# Patient Record
Sex: Male | Born: 1950
Health system: Southern US, Community
[De-identification: ages and names within clinical notes are randomized; demographics above are authoritative.]

## PROBLEM LIST (undated history)

## (undated) DIAGNOSIS — J189 Pneumonia, unspecified organism: Secondary | ICD-10-CM

## (undated) HISTORY — DX: Pneumonia, unspecified organism: J18.9

## (undated) HISTORY — PX: VALVE REPLACEMENT: SUR13

---

## 2020-11-05 ENCOUNTER — Encounter: Payer: Self-pay | Admitting: Family Medicine

## 2020-11-05 ENCOUNTER — Ambulatory Visit (INDEPENDENT_AMBULATORY_CARE_PROVIDER_SITE_OTHER): Payer: Medicare Other | Admitting: Family Medicine

## 2020-11-05 ENCOUNTER — Other Ambulatory Visit: Payer: Self-pay

## 2020-11-05 VITALS — BP 150/80 | HR 91 | Temp 97.7°F | Ht 64.0 in | Wt 103.0 lb

## 2020-11-05 DIAGNOSIS — Z1211 Encounter for screening for malignant neoplasm of colon: Secondary | ICD-10-CM | POA: Diagnosis not present

## 2020-11-05 DIAGNOSIS — J449 Chronic obstructive pulmonary disease, unspecified: Secondary | ICD-10-CM

## 2020-11-05 DIAGNOSIS — Z13220 Encounter for screening for lipoid disorders: Secondary | ICD-10-CM | POA: Diagnosis not present

## 2020-11-05 DIAGNOSIS — Z87891 Personal history of nicotine dependence: Secondary | ICD-10-CM

## 2020-11-05 DIAGNOSIS — Z23 Encounter for immunization: Secondary | ICD-10-CM

## 2020-11-05 DIAGNOSIS — R351 Nocturia: Secondary | ICD-10-CM

## 2020-11-05 NOTE — Patient Instructions (Addendum)
It was very nice to see you today!  We will check blood work today.  I will place a referral for you to see a pulmonologist.  We will give your flu vaccine and pneumonia vaccine today.  I will see back in 6 to 12 months.  Please come back to see me sooner if needed.  Take care, Dr Jimmey Ralph  Please try these tips to maintain a healthy lifestyle:   Eat at least 3 REAL meals and 1-2 snacks per day.  Aim for no more than 5 hours between eating.  If you eat breakfast, please do so within one hour of getting up.    Each meal should contain half fruits/vegetables, one quarter protein, and one quarter carbs (no bigger than a computer mouse)   Cut down on sweet beverages. This includes juice, soda, and sweet tea.     Drink at least 1 glass of water with each meal and aim for at least 8 glasses per day   Exercise at least 150 minutes every week.

## 2020-11-05 NOTE — Assessment & Plan Note (Signed)
Will likely need lung cancer screening, though will defer to pulmonology.

## 2020-11-05 NOTE — Assessment & Plan Note (Signed)
Normal exam today.  No red flags.  Will place referral to pulmonology for further testing and management.

## 2020-11-05 NOTE — Progress Notes (Signed)
Matthew Coleman is a 69 y.o. male who presents today for an office visit.  He is a new patient and his here with his daughter today.   Assessment/Plan:  Chronic Problems Addressed Today: COPD (chronic obstructive pulmonary disease) (HCC) Normal exam today.  No red flags.  Will place referral to pulmonology for further testing and management.  Former smoker Will likely need lung cancer screening, though will defer to pulmonology.  Preventative Healthcare Flu and pneumonia vaccines given today.  Will check labs including CBC, CMET, TSH, lipid panel, and PSA.  He will be getting COVID vaccine at pharmacy.  Discussed colon cancer screening and will place order for Cologuard.  Follow up in 6-12 months.     Subjective:  HPI:  Patient here to establish care as a new patient.  He recently relocated to [place] about 2 months ago. Was previously in [place]. He moved to be closer to his family after being admitted to the hospital 2 months ago with pneumonia.  He initially presented to the ED with chest pressure and shortness of breath.  Covid test was negative.  Admitted for IV antibiotics including ceftriaxone and azithromycin.  Was concern for COPD and he was given IV steroids as well.  Was initially on BiPAP.  Was able to be weaned off and was discharged home after 6-day hospital stay.  He has been doing well since being discharged home.  He is now living by himself.  Since being admitted with pneumonia he has stopped smoking and stopped drinking alcohol.  His daughter would like for him to be referred to a pulmonologist today.  He has about a 50-pack-year history of smoking.  Denies any dyspnea on exertion.  No chest pain.  No shortness of breath.  No cough.  Medical history otherwise only significant for valve replacement several years ago.  Patient cannot remember why this was done other than to "open things up. "  He has not seen a cardiologist in several years.  He has never had a  primary care physician.  ROS: Per HPI, otherwise a complete review of systems was negative.   PMH:  The following were reviewed and entered/updated in epic: History reviewed. No pertinent past medical history. Patient Active Problem List   Diagnosis Date Noted  . COPD (chronic obstructive pulmonary disease) (HCC) 11/05/2020  . Former smoker 11/05/2020   Past Surgical History:  Procedure Laterality Date  . VALVE REPLACEMENT      Family History  Problem Relation Age of Onset  . Stroke Mother     Medications- reviewed and updated No current outpatient medications on file.   No current facility-administered medications for this visit.    Allergies-reviewed and updated Allergies  Allergen Reactions  . Penicillin G Hives and Itching    Social History   Socioeconomic History  . Marital status: Single    Spouse name: Not on file  . Number of children: Not on file  . Years of education: Not on file  . Highest education level: Not on file  Occupational History  . Not on file  Tobacco Use  . Smoking status: Former Smoker    Packs/day: 1.00    Years: 50.00    Pack years: 50.00    Types: Cigarettes    Quit date: 07/26/2020    Years since quitting: 0.2  Substance and Sexual Activity  . Alcohol use: Yes    Comment: last beer 07/26/2020  . Drug use: Not Currently    Types:  Marijuana  . Sexual activity: Not on file  Other Topics Concern  . Not on file  Social History Narrative  . Not on file   Social Determinants of Health   Financial Resource Strain:   . Difficulty of Paying Living Expenses: Not on file  Food Insecurity:   . Worried About Programme researcher, broadcasting/film/video in the Last Year: Not on file  . Ran Out of Food in the Last Year: Not on file  Transportation Needs:   . Lack of Transportation (Medical): Not on file  . Lack of Transportation (Non-Medical): Not on file  Physical Activity:   . Days of Exercise per Week: Not on file  . Minutes of Exercise per Session:  Not on file  Stress:   . Feeling of Stress : Not on file  Social Connections:   . Frequency of Communication with Friends and Family: Not on file  . Frequency of Social Gatherings with Friends and Family: Not on file  . Attends Religious Services: Not on file  . Active Member of Clubs or Organizations: Not on file  . Attends Banker Meetings: Not on file  . Marital Status: Not on file          Objective:  Physical Exam: BP (!) 150/80   Pulse 91   Temp 97.7 F (36.5 C) (Temporal)   Ht 5\' 4"  (1.626 m)   Wt 103 lb (46.7 kg)   SpO2 99%   BMI 17.68 kg/m   Gen: No acute distress, resting comfortably CV: Regular rate and rhythm with no murmurs appreciated Pulm: Normal work of breathing, clear to auscultation bilaterally with no crackles, wheezes, or rhonchi Neuro: Grossly normal, moves all extremities Psych: Normal affect and thought content  Time Spent: 65 minutes of total time was spent on the date of the encounter performing the following actions: chart review prior to seeing the patient including records from recent hospitalization, obtaining history, performing a medically necessary exam, counseling on the treatment plan, placing orders, and documenting in our EHR.        . Katina Degree, MD 11/05/2020 11:32 AM

## 2020-11-06 LAB — CBC
HCT: 40.4 % (ref 38.5–50.0)
Hemoglobin: 12.9 g/dL — ABNORMAL LOW (ref 13.2–17.1)
MCH: 29.3 pg (ref 27.0–33.0)
MCHC: 31.9 g/dL — ABNORMAL LOW (ref 32.0–36.0)
MCV: 91.8 fL (ref 80.0–100.0)
MPV: 9.1 fL (ref 7.5–12.5)
Platelets: 308 10*3/uL (ref 140–400)
RBC: 4.4 10*6/uL (ref 4.20–5.80)
RDW: 12.7 % (ref 11.0–15.0)
WBC: 7.7 10*3/uL (ref 3.8–10.8)

## 2020-11-06 LAB — COMPREHENSIVE METABOLIC PANEL WITH GFR
AG Ratio: 1.2 (calc) (ref 1.0–2.5)
ALT: 15 U/L (ref 9–46)
AST: 25 U/L (ref 10–35)
Albumin: 4.2 g/dL (ref 3.6–5.1)
Alkaline phosphatase (APISO): 74 U/L (ref 35–144)
BUN: 12 mg/dL (ref 7–25)
CO2: 27 mmol/L (ref 20–32)
Calcium: 9.9 mg/dL (ref 8.6–10.3)
Chloride: 101 mmol/L (ref 98–110)
Creat: 0.8 mg/dL (ref 0.70–1.18)
Globulin: 3.4 g/dL (ref 1.9–3.7)
Glucose, Bld: 83 mg/dL (ref 65–99)
Potassium: 4.4 mmol/L (ref 3.5–5.3)
Sodium: 137 mmol/L (ref 135–146)
Total Bilirubin: 0.6 mg/dL (ref 0.2–1.2)
Total Protein: 7.6 g/dL (ref 6.1–8.1)

## 2020-11-06 LAB — LIPID PANEL
Cholesterol: 149 mg/dL (ref <200)
HDL: 59 mg/dL (ref >=40)
LDL Cholesterol (Calc): 72 mg/dL (calc)
Non-HDL Cholesterol (Calc): 90 mg/dL (calc) (ref <130)
Total CHOL/HDL Ratio: 2.5 (calc) (ref <5.0)
Triglycerides: 92 mg/dL (ref <150)

## 2020-11-06 LAB — TSH: TSH: 0.01 m[IU]/L — ABNORMAL LOW (ref 0.40–4.50)

## 2020-11-06 LAB — PSA: PSA: 0.43 ng/mL (ref <=4.0)

## 2020-11-06 NOTE — Progress Notes (Signed)
Please inform patient of the following:  Blood work looks like he has an overactive thyroid. Recommend he come back to recheck. Please place future order for TSH, free T4, and free T3.  Matthew Coleman. Jimmey Ralph, MD 11/06/2020 8:02 AM

## 2020-11-09 ENCOUNTER — Telehealth: Payer: Self-pay

## 2020-11-09 ENCOUNTER — Other Ambulatory Visit: Payer: Self-pay

## 2020-11-09 DIAGNOSIS — E0590 Thyrotoxicosis, unspecified without thyrotoxic crisis or storm: Secondary | ICD-10-CM

## 2020-11-09 NOTE — Telephone Encounter (Signed)
Returned pt call and labs reviewed.  

## 2020-11-09 NOTE — Telephone Encounter (Signed)
Patient called regarding lab results 

## 2020-11-16 ENCOUNTER — Other Ambulatory Visit: Payer: Medicare Other

## 2020-11-16 ENCOUNTER — Other Ambulatory Visit: Payer: Self-pay

## 2020-11-16 DIAGNOSIS — E0590 Thyrotoxicosis, unspecified without thyrotoxic crisis or storm: Secondary | ICD-10-CM | POA: Diagnosis not present

## 2020-11-16 LAB — TSH: TSH: <0.01 mIU/L — ABNORMAL LOW (ref 0.40–4.50)

## 2020-11-16 LAB — T4, FREE: Free T4: 1.6 ng/dL (ref 0.8–1.8)

## 2020-11-16 LAB — T3, FREE: T3, Free: 4.5 pg/mL — ABNORMAL HIGH (ref 2.3–4.2)

## 2020-11-17 ENCOUNTER — Other Ambulatory Visit: Payer: Self-pay | Admitting: *Deleted

## 2020-11-17 DIAGNOSIS — R946 Abnormal results of thyroid function studies: Secondary | ICD-10-CM

## 2020-11-17 NOTE — Progress Notes (Signed)
Please inform patient of the following:  Blood test confirms he is getting too much thyroid. Recommend referral to endocrinology for further evaluation and management.  Matthew Coleman. Jimmey Ralph, MD 11/17/2020 2:24 PM

## 2021-01-01 ENCOUNTER — Other Ambulatory Visit: Payer: Self-pay

## 2021-01-01 ENCOUNTER — Encounter: Payer: Self-pay | Admitting: Endocrinology

## 2021-01-01 ENCOUNTER — Ambulatory Visit: Payer: Medicare Other | Admitting: Endocrinology

## 2021-01-01 DIAGNOSIS — E0590 Thyrotoxicosis, unspecified without thyrotoxic crisis or storm: Secondary | ICD-10-CM | POA: Diagnosis not present

## 2021-01-01 LAB — TSH: TSH: 2.28 u[IU]/mL (ref 0.35–4.50)

## 2021-01-01 LAB — T4, FREE: Free T4: 0.82 ng/dL (ref 0.60–1.60)

## 2021-01-01 NOTE — Progress Notes (Signed)
Subjective:    Patient ID: Matthew Coleman, male    DOB: 26-May-1951, 70 y.o.   MRN: [id number]  HPI Pt is referred by Dr Jimmey Ralph, for hyperthyroidism.  he was dx'ed with hyperthyroidism in 2021.  He has never been on therapy for this.  He has never had XRT to the anterior neck, or thyroid surgery.  He has never had thyroid imaging.  He does not consume kelp or any other non-prescribed thyroid medication.  He has never been on amiodarone.  He lost weight during admission, 5 mos ago.  He has since regained.   History reviewed. No pertinent past medical history.  Past Surgical History:  Procedure Laterality Date  . VALVE REPLACEMENT      Social History   Socioeconomic History  . Marital status: Single    Spouse name: Not on file  . Number of children: Not on file  . Years of education: Not on file  . Highest education level: Not on file  Occupational History  . Not on file  Tobacco Use  . Smoking status: Former Smoker    Packs/day: 1.00    Years: 50.00    Pack years: 50.00    Types: Cigarettes    Quit date: 07/26/2020    Years since quitting: 0.4  . Smokeless tobacco: Never Used  Substance and Sexual Activity  . Alcohol use: Yes    Comment: last beer 07/26/2020  . Drug use: Not Currently    Types: Marijuana  . Sexual activity: Not on file  Other Topics Concern  . Not on file  Social History Narrative  . Not on file   Social Determinants of Health   Financial Resource Strain: Not on file  Food Insecurity: Not on file  Transportation Needs: Not on file  Physical Activity: Not on file  Stress: Not on file  Social Connections: Not on file  Intimate Partner Violence: Not on file    No current outpatient medications on file prior to visit.   No current facility-administered medications on file prior to visit.    Allergies  Allergen Reactions  . Penicillin G Hives and Itching    Family History  Problem Relation Age of Onset  . Stroke Mother   . Thyroid  disease Neg Hx     BP 140/88   Pulse 88   Ht 5\' 5"  (1.651 m)   Wt 101 lb (45.8 kg)   SpO2 99%   BMI 16.81 kg/m   Review of Systems denies palpitations, sob, fever, excessive diaphoresis, tremor, anxiety, and heat intolerance.       Objective:   Physical Exam VS: see vs page GEN: no distress HEAD: head: no deformity eyes: no periorbital swelling, no proptosis external nose and ears are normal NECK: supple, thyroid is not enlarged CHEST WALL: no deformity LUNGS: clear to auscultation CV: reg rate and rhythm, no murmur.  MUSCULOSKELETAL: gait is normal and steady EXTEMITIES: no deformity.  no leg edema NEURO:  readily moves all 4's.  sensation is intact to touch on all 4's.  No tremor.   SKIN:  Normal texture and temperature.  No rash or suspicious lesion is visible.  Not diaphoretic NODES:  None palpable at the neck PSYCH: alert, well-oriented.  Does not appear anxious nor depressed.   Lab Results  Component Value Date   TSH <0.01 (L) 11/16/2020   Lab Results  Component Value Date   TSH 2.28 01/01/2021   I have reviewed outside records, and summarized: Pt  was noted to have low TSH, and referred here.  Wellness and COPD were also addressed.       Assessment & Plan:  Hyperthyroidism, new to me.  Resolved on recheck. He is at high risk for recurrent abnormal thyroid function.  We'll hold off on medication for now Please come back for a follow-up appointment in 1 month.

## 2021-01-01 NOTE — Patient Instructions (Addendum)
Blood tests are requested for you today.  We'll let you know about the results.  If it is high again, I'll prescribe for a the pill to slow it down.  If ever you have fever while taking this, stop it and call us, even if the reason is obvious, because of the risk of a rare side-effect. It is best to never miss the medication.  However, if you do miss it, next best is to double up the next time. Please come back for a follow-up appointment in 1 month.

## 2021-01-15 ENCOUNTER — Institutional Professional Consult (permissible substitution): Payer: Medicare Other | Admitting: Pulmonary Disease

## 2021-02-01 ENCOUNTER — Ambulatory Visit (INDEPENDENT_AMBULATORY_CARE_PROVIDER_SITE_OTHER): Payer: Medicare Other | Admitting: Endocrinology

## 2021-02-01 ENCOUNTER — Other Ambulatory Visit: Payer: Self-pay

## 2021-02-01 VITALS — BP 150/80 | HR 68 | Ht 65.0 in | Wt 100.2 lb

## 2021-02-01 DIAGNOSIS — E0590 Thyrotoxicosis, unspecified without thyrotoxic crisis or storm: Secondary | ICD-10-CM | POA: Diagnosis not present

## 2021-02-01 LAB — TSH: TSH: 2.21 u[IU]/mL (ref 0.35–4.50)

## 2021-02-01 LAB — T4, FREE: Free T4: 0.85 ng/dL (ref 0.60–1.60)

## 2021-02-01 NOTE — Patient Instructions (Addendum)
Your blood pressure is high today.  Please see your primary care provider soon, to have it rechecked °Blood tests are requested for you today.  We'll let you know about the results.  °Please come back for a follow-up appointment in 3 months.  ° °

## 2021-02-01 NOTE — Progress Notes (Signed)
   Subjective:    Patient ID: Matthew Coleman, male    DOB: [birth year], 70 y.o.   MRN: [id number]  HPI Pt returns for f/u of hyperthyroidism (dx'ed 2021; he has never been on therapy for this; recheck was normal; he has never had thyroid imaging).  pt states he feels well in general.   No past medical history on file.  Past Surgical History:  Procedure Laterality Date  . VALVE REPLACEMENT      Social History   Socioeconomic History  . Marital status: Single    Spouse name: Not on file  . Number of children: Not on file  . Years of education: Not on file  . Highest education level: Not on file  Occupational History  . Not on file  Tobacco Use  . Smoking status: Former Smoker    Packs/day: 1.00    Years: 50.00    Pack years: 50.00    Types: Cigarettes    Quit date: 07/26/2020    Years since quitting: 0.5  . Smokeless tobacco: Never Used  Substance and Sexual Activity  . Alcohol use: Yes    Comment: last beer 07/26/2020  . Drug use: Not Currently    Types: Marijuana  . Sexual activity: Not on file  Other Topics Concern  . Not on file  Social History Narrative  . Not on file   Social Determinants of Health   Financial Resource Strain: Not on file  Food Insecurity: Not on file  Transportation Needs: Not on file  Physical Activity: Not on file  Stress: Not on file  Social Connections: Not on file  Intimate Partner Violence: Not on file    No current outpatient medications on file prior to visit.   No current facility-administered medications on file prior to visit.    Allergies  Allergen Reactions  . Penicillin G Hives and Itching    Family History  Problem Relation Age of Onset  . Stroke Mother   . Thyroid disease Neg Hx     BP (!) 150/80 (BP Location: Right Arm, Patient Position: Sitting, Cuff Size: Normal)   Pulse 68   Ht 5\' 5"  (1.651 m)   Wt 100 lb 3.2 oz (45.5 kg)   SpO2 95%   BMI 16.67 kg/m    Review of Systems No weight change     Objective:   Physical Exam VITAL SIGNS:  See vs page GENERAL: no distress.  NECK: There is no palpable thyroid enlargement.  No thyroid nodule is palpable.  No palpable lymphadenopathy at the anterior neck.  Lab Results  Component Value Date   TSH 2.21 02/01/2021      Assessment & Plan:  Hyperthyroidism, in remission. I told pt no medication is needed now.  Please come back for a follow-up appointment in 3 months.

## 2021-03-02 ENCOUNTER — Ambulatory Visit: Payer: Medicare Other | Admitting: Pulmonary Disease

## 2021-03-02 ENCOUNTER — Encounter: Payer: Self-pay | Admitting: Pulmonary Disease

## 2021-03-02 ENCOUNTER — Other Ambulatory Visit: Payer: Self-pay

## 2021-03-02 VITALS — BP 142/80 | HR 78 | Temp 97.3°F | Ht 65.0 in | Wt 101.8 lb

## 2021-03-02 DIAGNOSIS — J439 Emphysema, unspecified: Secondary | ICD-10-CM

## 2021-03-02 NOTE — Progress Notes (Signed)
Matthew Coleman    [id number]    09-24-1951  Primary Care Physician:Parker, Katina Degree, MD  Referring Physician: Ardith Dark, MD [address] [place],  [place] [address]  Chief complaint: Consult for COPD evaluation  HPI: 70 year old ex-smoker with history of allergies. He was admitted for pneumonia in August 2021 at [facility].  Covid negative CT scan at that time showed lung infiltrates and baseline emphysematous changes.  He has been referred for evaluation of COPD  States that his breathing is doing well with no issues.  Denies any cough, sputum production, congestion.  He is not on any inhalers Walks up and down 3 flights of stairs every day.  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  No feather pillows or comforters Smoking history: 20-pack-year smoker.  Quit in August 2021 Travel history: Originally from [place], no significant recent travel Relevant family history: Daughter had a PE after a long car trip.  No other significant family history of lung disease  No outpatient encounter medications on file as of 03/02/2021.   No facility-administered encounter medications on file as of 03/02/2021.    Allergies as of 03/02/2021 - Review Complete 03/02/2021  Allergen Reaction Noted  . Penicillin g Hives and Itching 08/22/2020    Past Medical History:  Diagnosis Date  . Pneumonia     Past Surgical History:  Procedure Laterality Date  . VALVE REPLACEMENT      Family History  Problem Relation Age of Onset  . Stroke Mother   . Thyroid disease Neg Hx     Social History   Socioeconomic History  . Marital status: Single    Spouse name: Not on file  . Number of children: Not on file  . Years of education: Not on file  . Highest education level: Not on file  Occupational History  . Not on file  Tobacco Use  . Smoking  status: Former Smoker    Packs/day: 0.50    Years: 40.00    Pack years: 20.00    Types: Cigarettes    Quit date: 07/26/2020    Years since quitting: 0.6  . Smokeless tobacco: Never Used  Vaping Use  . Vaping Use: Never used  Substance and Sexual Activity  . Alcohol use: Yes    Comment: last beer 07/26/2020  . Drug use: Not Currently    Types: Marijuana  . Sexual activity: Not on file  Other Topics Concern  . Not on file  Social History Narrative  . Not on file   Social Determinants of Health   Financial Resource Strain: Not on file  Food Insecurity: Not on file  Transportation Needs: Not on file  Physical Activity: Not on file  Stress: Not on file  Social Connections: Not on file  Intimate Partner Violence: Not on file    Review of systems: Review of Systems  Constitutional: Negative for fever and chills.  HENT: Negative.   Eyes: Negative for blurred vision.  Respiratory: as per HPI  Cardiovascular: Negative for chest pain and palpitations.  Gastrointestinal: Negative for vomiting, diarrhea, blood per rectum. Genitourinary: Negative for dysuria, urgency, frequency and hematuria.  Musculoskeletal: Negative for myalgias, back pain and joint pain.  Skin: Negative for itching and rash.  Neurological: Negative for dizziness, tremors, focal weakness, seizures and loss of consciousness.  Endo/Heme/Allergies: Negative for environmental allergies.  Psychiatric/Behavioral: Negative for depression, suicidal ideas and hallucinations.  All other systems reviewed and are negative.  Physical Exam: Blood pressure (!) 142/80, pulse 78, temperature (!) 97.3 F (36.3 C), temperature source Temporal, height 5\' 5"  (1.651 m), weight 101 lb 12.8 oz (46.2 kg), SpO2 99 %. Gen:      No acute distress HEENT:  EOMI, sclera anicteric Neck:     No masses; no thyromegaly Lungs:    Clear to auscultation bilaterally; normal respiratory effort CV:         Regular rate and rhythm; no murmurs Abd:       + bowel sounds; soft, non-tender; no palpable masses, no distension Ext:    No edema; adequate peripheral perfusion Skin:      Warm and dry; no rash Neuro: alert and oriented x 3 Psych: normal mood and affect  Data Reviewed: Imaging: CTA 08/21/2020.  Results in care everywhere IMPRESSION:  1. No evidence of pulmonary embolism, aneurysmal dilatation or dissection.  2. Bilateral airspace disease especially left lower lobe suspicious for pneumonia.  3. Underlying emphysematous changes and probable sequela from granulomatous disease.    PFTs:  Labs:  Assessment:  Emphysema Likely has COPD and ex-smoker He is currently asymptomatic and does not need inhalers Schedule PFTs for baseline evaluation of the lung Referral for low-dose screening CT of the chest  Plan/Recommendations: PFTs, low-dose screening CT of chest  08/23/2020 MD [facility] 03/02/2021, 9:30 AM  CC: 05/02/2021, MD

## 2021-03-02 NOTE — Patient Instructions (Signed)
We will schedule pulmonary function testing referral for low-dose screening CT of the chest Follow-up after PFTs

## 2021-03-10 ENCOUNTER — Telehealth: Payer: Self-pay | Admitting: Family Medicine

## 2021-03-10 NOTE — Telephone Encounter (Signed)
Left message for patient to call back and schedule Medicare Annual Wellness Visit (AWV) either virtually or in office. No detailed message left    Last AWV no information  please schedule at anytime with    This should be a 45 minute visit. 

## 2021-03-27 ENCOUNTER — Other Ambulatory Visit (HOSPITAL_COMMUNITY)
Admission: RE | Admit: 2021-03-27 | Discharge: 2021-03-27 | Disposition: A | Discharge status: Home / Self Care | Payer: Medicare Other | Source: Ambulatory Visit | Attending: Pulmonary Disease | Admitting: Pulmonary Disease

## 2021-03-27 DIAGNOSIS — Z20822 Contact with and (suspected) exposure to covid-19: Secondary | ICD-10-CM | POA: Diagnosis not present

## 2021-03-27 DIAGNOSIS — Z01812 Encounter for preprocedural laboratory examination: Secondary | ICD-10-CM | POA: Diagnosis not present

## 2021-03-27 LAB — SARS CORONAVIRUS 2 (TAT 6-24 HRS): SARS Coronavirus 2: NEGATIVE

## 2021-03-30 ENCOUNTER — Other Ambulatory Visit: Payer: Self-pay

## 2021-03-30 ENCOUNTER — Ambulatory Visit (INDEPENDENT_AMBULATORY_CARE_PROVIDER_SITE_OTHER): Payer: Medicare Other | Admitting: Pulmonary Disease

## 2021-03-30 ENCOUNTER — Encounter: Payer: Self-pay | Admitting: Pulmonary Disease

## 2021-03-30 VITALS — BP 120/80 | HR 71 | Temp 97.8°F | Ht 65.0 in | Wt 101.6 lb

## 2021-03-30 DIAGNOSIS — J439 Emphysema, unspecified: Secondary | ICD-10-CM | POA: Diagnosis not present

## 2021-03-30 LAB — PULMONARY FUNCTION TEST
DL/VA % pred: 53 %
DL/VA: 2.19 ml/min/mmHg/L
DLCO cor % pred: 45 %
DLCO cor: 9.99 ml/min/mmHg
DLCO unc % pred: 45 %
DLCO unc: 9.99 ml/min/mmHg
FEF 25-75 Post: 1.13 L/sec
FEF 25-75 Pre: 1.21 L/sec
FEF2575-%Change-Post: -6 %
FEF2575-%Pred-Post: 56 %
FEF2575-%Pred-Pre: 60 %
FEV1-%Change-Post: 0 %
FEV1-%Pred-Post: 86 %
FEV1-%Pred-Pre: 86 %
FEV1-Post: 1.98 L
FEV1-Pre: 1.99 L
FEV1FVC-%Change-Post: 1 %
FEV1FVC-%Pred-Pre: 91 %
FEV6-%Change-Post: 0 %
FEV6-%Pred-Post: 95 %
FEV6-%Pred-Pre: 95 %
FEV6-Post: 2.79 L
FEV6-Pre: 2.8 L
FEV6FVC-%Change-Post: 0 %
FEV6FVC-%Pred-Post: 105 %
FEV6FVC-%Pred-Pre: 104 %
FVC-%Change-Post: -1 %
FVC-%Pred-Post: 90 %
FVC-%Pred-Pre: 92 %
FVC-Post: 2.81 L
FVC-Pre: 2.86 L
Post FEV1/FVC ratio: 71 %
Post FEV6/FVC ratio: 99 %
Pre FEV1/FVC ratio: 69 %
Pre FEV6/FVC Ratio: 99 %
RV % pred: 97 %
RV: 2.14 L
TLC % pred: 81 %
TLC: 4.89 L

## 2021-03-30 NOTE — Progress Notes (Signed)
PFT done today. 

## 2021-03-30 NOTE — Progress Notes (Signed)
         Matthew Coleman    [id number]    [birth year]  Primary Care Physician:Parker, Katina Degree, MD  Referring Physician: Ardith Dark, MD [address] [place],  [place] [address]  Chief complaint: Follow-up for emphysema  HPI: 70 year old ex-smoker with history of allergies. He was admitted for pneumonia in August 2021 at [facility].  Covid negative CT scan at that time showed lung infiltrates and baseline emphysematous changes.  He has been referred for evaluation of COPD  States that his breathing is doing well with no issues.  Denies any cough, sputum production, congestion.  He is not on any inhalers Walks up and down 3 flights of stairs every day.  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  No feather pillows or comforters Smoking history: 20-pack-year smoker.  Quit in August 2021 Travel history: Originally from [place], no significant recent travel Relevant family history: Daughter had a PE after a long car trip.  No other significant family history of lung disease  Interim history: Here for review of PFTs.  States that breathing is doing well with no issues  No outpatient encounter medications on file as of 03/30/2021.   No facility-administered encounter medications on file as of 03/30/2021.   Physical Exam: Blood pressure 120/80, pulse 71, temperature 97.8 F (36.6 C), temperature source Temporal, height 5\' 5"  (1.651 m), weight 101 lb 9.6 oz (46.1 kg), SpO2 99 %. Gen:      No acute distress, frail HEENT:  EOMI, sclera anicteric Neck:     No masses; no thyromegaly Lungs:    Clear to auscultation bilaterally; normal respiratory effort CV:         Regular rate and rhythm; no murmurs Abd:      + bowel sounds; soft, non-tender; no palpable masses, no distension Ext:    No edema; adequate peripheral perfusion Skin:      Warm and dry; no  rash Neuro: alert and oriented x 3 Psych: normal mood and affect  Data Reviewed: Imaging: CTA 08/21/2020.  Results in care everywhere IMPRESSION:  1. No evidence of pulmonary embolism, aneurysmal dilatation or dissection.  2. Bilateral airspace disease especially left lower lobe suspicious for pneumonia.  3. Underlying emphysematous changes and probable sequela from granulomatous disease.    PFTs: 03/30/2021 FVC 2.81 [90%], FEV1 1.98 [86%], F/F 71, TLC 4.89 [81%], DLCO 9.99 [45%] Severe diffusion defect  Labs:  Assessment:  Emphysema PFTs reviewed with no significant obstruction He is currently asymptomatic and does not need inhalers Referred for low-dose screening CT of the chest but has not been scheduled yet  He does have isolated diffusion capacity, no evidence of ILD on CT report.   Order echocardiogram to evaluate for pulmonary hypertension  Plan/Recommendations: Low-dose screening CT of chest Echocardiogram  05/30/2021 MD [facility] 03/30/2021, 10:41 AM  CC: 05/30/2021, MD

## 2021-03-30 NOTE — Patient Instructions (Signed)
Your lung function test does not show significant COPD We will continue to observe you off inhalers  We will make sure that the low-dose screening CTs are scheduled Order echocardiogram for evaluation of the heart  Follow-up in 6 months

## 2021-04-15 ENCOUNTER — Telehealth: Payer: Self-pay | Admitting: Acute Care

## 2021-04-15 DIAGNOSIS — Z87891 Personal history of nicotine dependence: Secondary | ICD-10-CM

## 2021-04-20 NOTE — Telephone Encounter (Signed)
Spoke with pt and scheduled SDMV 06/02/21 9:00 CT ordered Nothing further needed

## 2021-04-28 DIAGNOSIS — H2513 Age-related nuclear cataract, bilateral: Secondary | ICD-10-CM | POA: Diagnosis not present

## 2021-04-28 DIAGNOSIS — H40033 Anatomical narrow angle, bilateral: Secondary | ICD-10-CM | POA: Diagnosis not present

## 2021-05-07 ENCOUNTER — Ambulatory Visit: Payer: Medicare Other | Admitting: Endocrinology

## 2021-05-07 ENCOUNTER — Other Ambulatory Visit: Payer: Self-pay

## 2021-05-07 VITALS — BP 156/80 | HR 73 | Ht 65.0 in | Wt 102.0 lb

## 2021-05-07 DIAGNOSIS — E0590 Thyrotoxicosis, unspecified without thyrotoxic crisis or storm: Secondary | ICD-10-CM | POA: Diagnosis not present

## 2021-05-07 LAB — TSH: TSH: 0.87 u[IU]/mL (ref 0.35–4.50)

## 2021-05-07 LAB — T4, FREE: Free T4: 0.89 ng/dL (ref 0.60–1.60)

## 2021-05-07 NOTE — Progress Notes (Signed)
   Subjective:    Patient ID: Matthew Coleman, male    DOB: Mar 24, 1951, 70 y.o.   MRN: [id number]  HPI Pt returns for f/u of hyperthyroidism (dx'ed 2021; he has never been on therapy for this; recheck was normal; he has never had thyroid imaging).  pt states he feels well in general.   Past Medical History:  Diagnosis Date  . Pneumonia     Past Surgical History:  Procedure Laterality Date  . VALVE REPLACEMENT      Social History   Socioeconomic History  . Marital status: Single    Spouse name: Not on file  . Number of children: Not on file  . Years of education: Not on file  . Highest education level: Not on file  Occupational History  . Not on file  Tobacco Use  . Smoking status: Former Smoker    Packs/day: 0.50    Years: 40.00    Pack years: 20.00    Types: Cigarettes    Quit date: 07/26/2020    Years since quitting: 0.7  . Smokeless tobacco: Never Used  Vaping Use  . Vaping Use: Never used  Substance and Sexual Activity  . Alcohol use: Yes    Comment: last beer 07/26/2020  . Drug use: Not Currently    Types: Marijuana  . Sexual activity: Not on file  Other Topics Concern  . Not on file  Social History Narrative  . Not on file   Social Determinants of Health   Financial Resource Strain: Not on file  Food Insecurity: Not on file  Transportation Needs: Not on file  Physical Activity: Not on file  Stress: Not on file  Social Connections: Not on file  Intimate Partner Violence: Not on file    No current outpatient medications on file prior to visit.   No current facility-administered medications on file prior to visit.    Allergies  Allergen Reactions  . Penicillin G Hives and Itching    Family History  Problem Relation Age of Onset  . Stroke Mother   . Thyroid disease Neg Hx     BP (!) 156/80 (BP Location: Right Arm, Patient Position: Sitting, Cuff Size: Normal)   Pulse 73   Ht 5\' 5"  (1.651 m)   Wt 102 lb (46.3 kg)   SpO2 96%   BMI 16.97  kg/m    Review of Systems     Objective:   Physical Exam VITAL SIGNS:  See vs page GENERAL: no distress NECK: There is no palpable thyroid enlargement.  No thyroid nodule is palpable.  No palpable lymphadenopathy at the anterior neck.     Lab Results  Component Value Date   TSH 0.87 05/07/2021       Assessment & Plan:  Hyperthyroidism, in remission.  He is at risk for recurrent abnormal thyroid function.  No medication is needed now.

## 2021-05-07 NOTE — Patient Instructions (Addendum)
Your blood pressure is high today.  Please see your primary care provider soon, to have it rechecked Blood tests are requested for you today.  We'll let you know about the results.   Please come back for a follow-up appointment in 6 months.   

## 2021-06-02 ENCOUNTER — Encounter: Payer: Self-pay | Admitting: Acute Care

## 2021-06-02 ENCOUNTER — Ambulatory Visit (INDEPENDENT_AMBULATORY_CARE_PROVIDER_SITE_OTHER): Payer: Medicare Other | Admitting: Acute Care

## 2021-06-02 ENCOUNTER — Ambulatory Visit (INDEPENDENT_AMBULATORY_CARE_PROVIDER_SITE_OTHER)
Admission: RE | Admit: 2021-06-02 | Discharge: 2021-06-02 | Disposition: A | Discharge status: Home / Self Care | Payer: Medicare Other | Source: Ambulatory Visit | Attending: Interventional Cardiology | Admitting: Interventional Cardiology

## 2021-06-02 ENCOUNTER — Other Ambulatory Visit: Payer: Self-pay

## 2021-06-02 VITALS — BP 120/80 | HR 83 | Ht 65.0 in | Wt 99.2 lb

## 2021-06-02 DIAGNOSIS — Z87891 Personal history of nicotine dependence: Secondary | ICD-10-CM | POA: Diagnosis not present

## 2021-06-02 NOTE — Progress Notes (Signed)
Shared Decision Making Visit Lung Cancer Screening Program [phone])   Eligibility:  Age 70 y.o.  Pack Years Smoking History Calculation 25 pack year smoking history (# packs/per year x # years smoked)  Recent History of coughing up blood  no  Unexplained weight loss? no ( >Than 15 pounds within the last 6 months )  Prior History Lung / other cancer no (Diagnosis within the last 5 years already requiring surveillance chest CT Scans).  Smoking Status Former Smoker  Former Smokers: Years since quit: < 1 year  Quit Date: 08/2020  Visit Components:  Discussion included one or more decision making aids. yes  Discussion included risk/benefits of screening. yes  Discussion included potential follow up diagnostic testing for abnormal scans. yes  Discussion included meaning and risk of over diagnosis. yes  Discussion included meaning and risk of False Positives. yes  Discussion included meaning of total radiation exposure. yes  Counseling Included:  Importance of adherence to annual lung cancer LDCT screening. yes  Impact of comorbidities on ability to participate in the program. yes  Ability and willingness to under diagnostic treatment. yes  Smoking Cessation Counseling:  Current Smokers:   Discussed importance of smoking cessation. yes  Information about tobacco cessation classes and interventions provided to patient. yes  Patient provided with "ticket" for LDCT Scan. yes  Symptomatic Patient. no  Counseling NA  Diagnosis Code: Tobacco Use Z72.0  Asymptomatic Patient yes  Counseling (Intermediate counseling: > three minutes counseling) E4235  Former Smokers:   Discussed the importance of maintaining cigarette abstinence. yes  Diagnosis Code: Personal History of Nicotine Dependence. T61.443  Information about tobacco cessation classes and interventions provided to patient. Yes  Patient provided with "ticket" for LDCT Scan. yes  Written Order for Lung  Cancer Screening with LDCT placed in Epic. Yes (CT Chest Lung Cancer Screening Low Dose W/O CM) XVQ0086 Z12.2-Screening of respiratory organs Z87.891-Personal history of nicotine dependence  I spent 25 minutes of face to face time with Matthew Coleman and his daughter discussing the risks and benefits of lung cancer screening. We viewed a power point together that explained in detail the above noted topics. We took the time to pause the power point at intervals to allow for questions to be asked and answered to ensure understanding. We discussed that he had taken the single most powerful action possible to decrease his risk of developing lung cancer when he quit smoking. I counseled him to remain smoke free, and to contact me if he ever had the desire to smoke again so that I can provide resources and tools to help support the effort to remain smoke free. We discussed the time and location of the scan, and that either  Abigail Miyamoto RN or I will call with the results within  24-48 hours of receiving them. He has my card and contact information in the event he needs to speak with me, in addition to a copy of the power point we reviewed as a resource. He verbalized understanding of all of the above and had no further questions upon leaving the office.     I explained to the patient that there has been a high incidence of coronary artery disease noted on these exams. I explained that this is a non-gated exam therefore degree or severity cannot be determined. This patient is currently not on statin therapy. He is not on any medications at all.  I have asked the patient to follow-up with their PCP regarding any incidental finding  of coronary artery disease and management with diet or medication as they feel is clinically indicated. The patient verbalized understanding of the above and had no further questions.     Bevelyn Ngo, NP 06/02/2021

## 2021-06-02 NOTE — Patient Instructions (Signed)
Thank you for participating in the [facility]. It was our pleasure to meet you today. We will call you with the results of your scan within the next few days. Your scan will be assigned a Lung RADS category score by the physicians reading the scans.  This Lung RADS score determines follow up scanning.  See below for description of categories, and follow up screening recommendations. We will be in touch to schedule your follow up screening annually or based on recommendations of our providers. We will fax a copy of your scan results to your Primary Care Physician, or the physician who referred you to the program, to ensure they have the results. Please call the office if you have any questions or concerns regarding your scanning experience or results.  Our office number is [phone]. Please speak with Denise Phelps, RN. She is our Lung Cancer Screening RN. If she is unavailable when you call, please have the office staff send her a message. She will return your call at her earliest convenience. Remember, if your scan is normal, we will scan you annually as long as you continue to meet the criteria for the program. (Age 55-77, Current smoker or smoker who has quit within the last 15 years). If you are a smoker, remember, quitting is the single most powerful action that you can take to decrease your risk of lung cancer and other pulmonary, breathing related problems. We know quitting is hard, and we are here to help.  Please let us know if there is anything we can do to help you meet your goal of quitting. If you are a former smoker, congratulations. We are proud of you! Remain smoke free! Remember you can refer friends or family members through the number above.  We will screen them to make sure they meet criteria for the program. Thank you for helping us take better care of you by participating in Lung Screening.  Lung RADS Categories:  Lung RADS 1: no nodules  or definitely non-concerning nodules.  Recommendation is for a repeat annual scan in 12 months.  Lung RADS 2:  nodules that are non-concerning in appearance and behavior with a very low likelihood of becoming an active cancer. Recommendation is for a repeat annual scan in 12 months.  Lung RADS 3: nodules that are probably non-concerning , includes nodules with a low likelihood of becoming an active cancer.  Recommendation is for a 6-month repeat screening scan. Often noted after an upper respiratory illness. We will be in touch to make sure you have no questions, and to schedule your 6-month scan.  Lung RADS 4 A: nodules with concerning findings, recommendation is most often for a follow up scan in 3 months or additional testing based on our provider's assessment of the scan. We will be in touch to make sure you have no questions and to schedule the recommended 3 month follow up scan.  Lung RADS 4 B:  indicates findings that are concerning. We will be in touch with you to schedule additional diagnostic testing based on our provider's  assessment of the scan.   

## 2021-06-10 ENCOUNTER — Other Ambulatory Visit: Payer: Self-pay | Admitting: *Deleted

## 2021-06-10 DIAGNOSIS — I272 Pulmonary hypertension, unspecified: Secondary | ICD-10-CM

## 2021-06-10 NOTE — Progress Notes (Signed)
Please call patient and let them  know their  low dose Ct was read as a Lung RADS 2: nodules that are benign in appearance and behavior with a very low likelihood of becoming a clinically active cancer due to size or lack of growth. Recommendation per radiology is for a repeat LDCT in 12 months. .Please let them  know we will order and schedule their  annual screening scan for 05/2022. Please let them  know there was notation of CAD on their  scan.  Please remind the patient  that this is a non-gated exam therefore degree or severity of disease  cannot be determined. Please have them  follow up with their PCP regarding potential risk factor modification, dietary therapy or pharmacologic therapy if clinically indicated. Pt.  is not  currently on statin therapy. Please place order for annual  screening scan for  05/2022 and fax results to PCP. Thanks so much.  Dr. Isaiah Serge, This patient's  CT shows some pulmonary artery enlargement. I do not see an echo in the system. I wanted to make sure you are aware. Thanks

## 2021-06-14 ENCOUNTER — Encounter: Payer: Self-pay | Admitting: *Deleted

## 2021-06-14 DIAGNOSIS — Z87891 Personal history of nicotine dependence: Secondary | ICD-10-CM

## 2021-06-21 ENCOUNTER — Encounter (HOSPITAL_COMMUNITY): Payer: Self-pay | Admitting: Pulmonary Disease

## 2021-07-02 ENCOUNTER — Telehealth (HOSPITAL_COMMUNITY): Payer: Self-pay | Admitting: Pulmonary Disease

## 2021-07-02 NOTE — Telephone Encounter (Signed)
Just an FYI. We have made several attempts to contact this patient including sending a letter to schedule or reschedule their echocardiogram. We will be removing the patient from the echo WQ.   MAILED LETTER [facility]  06/21/21 [facility] to schedule @ 3:42/[facility]  06/17/21 [facility] to schedule @ 9:30/[facility]  06/14/21 [facility] to schedule @ 12:11/[facility] needs in July      Thank you

## 2021-11-08 ENCOUNTER — Other Ambulatory Visit: Payer: Self-pay

## 2021-11-08 ENCOUNTER — Ambulatory Visit: Payer: Medicare Other | Admitting: Endocrinology

## 2021-11-08 VITALS — BP 180/96 | HR 90 | Ht 65.0 in | Wt 91.4 lb

## 2021-11-08 DIAGNOSIS — E0590 Thyrotoxicosis, unspecified without thyrotoxic crisis or storm: Secondary | ICD-10-CM

## 2021-11-08 NOTE — Progress Notes (Signed)
   Subjective:    Patient ID: Matthew Coleman, male    DOB: [birth year], 70 y.o.   MRN: [id number]  HPI Pt returns for f/u of hyperthyroidism (dx'ed 2021; he has never been on therapy for this; recheck was normal; he has never had thyroid imaging).  pt states he feels well in general.   Past Medical History:  Diagnosis Date   Pneumonia     Past Surgical History:  Procedure Laterality Date   VALVE REPLACEMENT      Social History   Socioeconomic History   Marital status: Single    Spouse name: Not on file   Number of children: Not on file   Years of education: Not on file   Highest education level: Not on file  Occupational History   Not on file  Tobacco Use   Smoking status: Former    Packs/day: 0.50    Years: 50.00    Pack years: 25.00    Types: Cigarettes    Quit date: 07/26/2020    Years since quitting: 1.2   Smokeless tobacco: Never  Vaping Use   Vaping Use: Never used  Substance and Sexual Activity   Alcohol use: Yes    Comment: last beer 07/26/2020   Drug use: Not Currently    Types: Marijuana   Sexual activity: Not on file  Other Topics Concern   Not on file  Social History Narrative   Not on file   Social Determinants of Health   Financial Resource Strain: Not on file  Food Insecurity: Not on file  Transportation Needs: Not on file  Physical Activity: Not on file  Stress: Not on file  Social Connections: Not on file  Intimate Partner Violence: Not on file    No current outpatient medications on file prior to visit.   No current facility-administered medications on file prior to visit.    Allergies  Allergen Reactions   Penicillin G Hives and Itching    Family History  Problem Relation Age of Onset   Stroke Mother    Thyroid disease Neg Hx     BP (!) 180/96 (BP Location: Left Arm, Patient Position: Sitting, Cuff Size: Normal)   Pulse 90   Ht 5\' 5"  (1.651 m)   Wt 91 lb 6.4 oz (41.5 kg)   SpO2 98%   BMI 15.21 kg/m    Review of  Systems     Objective:   Physical Exam VITAL SIGNS:  See vs page GENERAL: no distress NECK: There is no palpable thyroid enlargement.  No thyroid nodule is palpable.  No palpable lymphadenopathy at the anterior neck.      Assessment & Plan:  Hyperthyroidism, due for recheck. No medication is needed now.  Patient Instructions  Your blood pressure is high today.  Please see your primary care provider soon, to have it rechecked.   Blood tests are requested for you today.  We'll let you know about the results.   Please come back for a follow-up appointment in 1 year.

## 2021-11-08 NOTE — Patient Instructions (Signed)
Your blood pressure is high today.  Please see your primary care provider soon, to have it rechecked.   Blood tests are requested for you today.  We'll let you know about the results.   Please come back for a follow-up appointment in 1 year.   

## 2022-06-02 ENCOUNTER — Ambulatory Visit (INDEPENDENT_AMBULATORY_CARE_PROVIDER_SITE_OTHER)
Admission: RE | Admit: 2022-06-02 | Discharge: 2022-06-02 | Disposition: A | Discharge status: Home / Self Care | Payer: Medicare Other | Source: Ambulatory Visit | Attending: Acute Care | Admitting: Acute Care

## 2022-06-02 DIAGNOSIS — Z87891 Personal history of nicotine dependence: Secondary | ICD-10-CM | POA: Diagnosis not present

## 2022-06-06 ENCOUNTER — Other Ambulatory Visit: Payer: Self-pay

## 2022-06-06 DIAGNOSIS — Z87891 Personal history of nicotine dependence: Secondary | ICD-10-CM

## 2022-06-06 DIAGNOSIS — Z122 Encounter for screening for malignant neoplasm of respiratory organs: Secondary | ICD-10-CM

## 2022-09-19 ENCOUNTER — Encounter: Payer: Self-pay | Admitting: *Deleted

## 2022-12-08 ENCOUNTER — Encounter: Payer: Self-pay | Admitting: *Deleted

## 2023-06-05 ENCOUNTER — Ambulatory Visit (HOSPITAL_COMMUNITY): Payer: Medicare PPO

## 2023-08-09 IMAGING — CT CT CHEST LUNG CANCER SCREENING LOW DOSE W/O CM
2 of 4 series · 15 of 36 positions shown, 18 images · non-contrast
Comparison: 06/02/2021.

CLINICAL DATA: Former smoker, quit 7670, 25 pack-year history.



[Series 3: lung thins 1.0 · axial · 0.64mm/px · z∈[-390,-70]mm · 12 of 352 slices shown, 15 images]
[im 16/352  mediastinal]
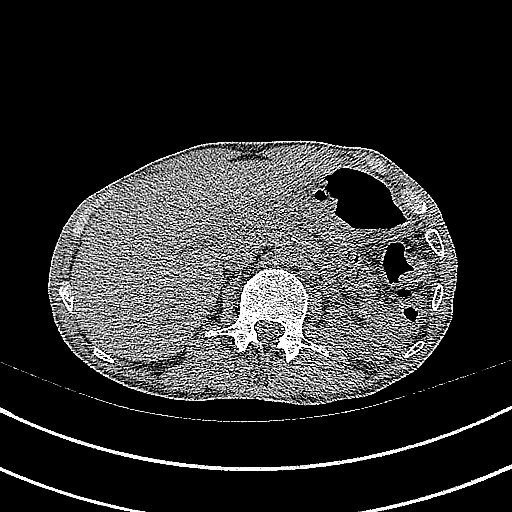
[im 16/352  lung]
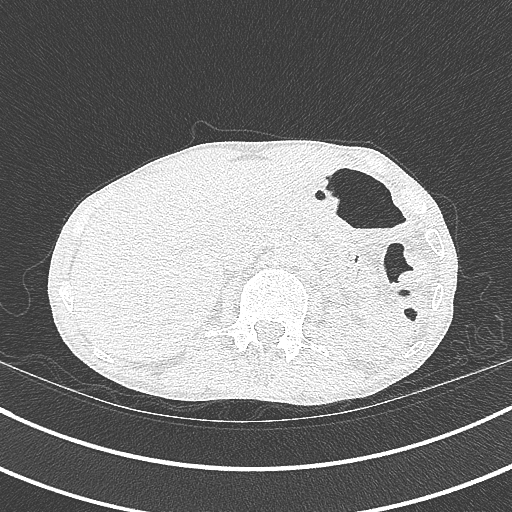
[im 48/352  lung]
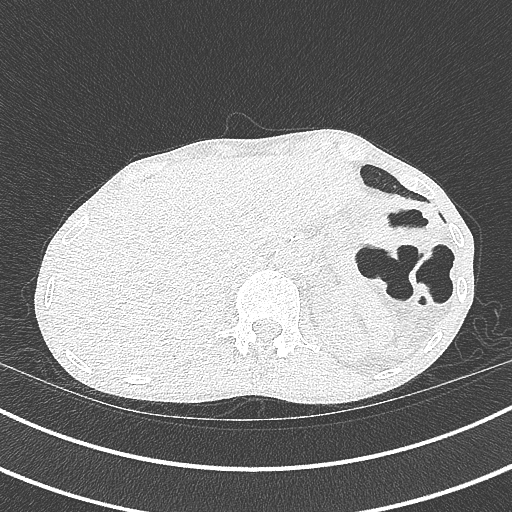
[im 80/352  lung]
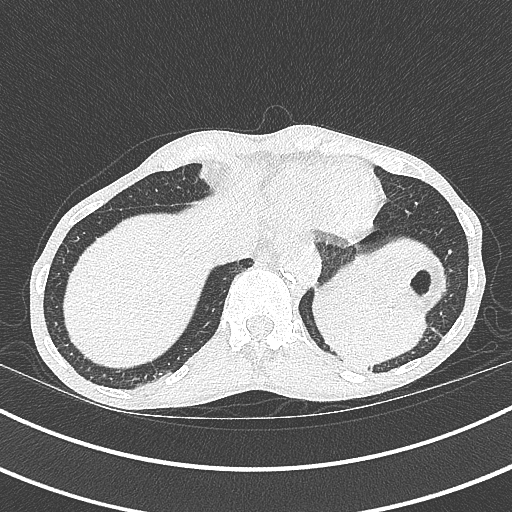
[im 112/352  lung]
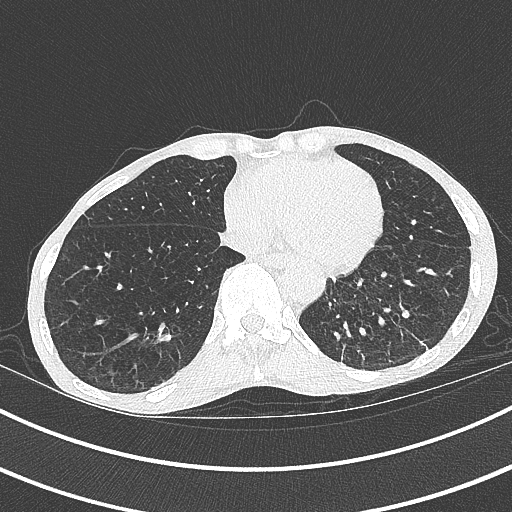
[im 128/352  mediastinal]
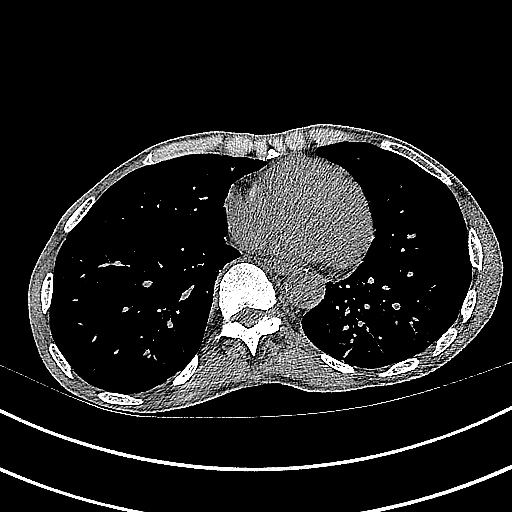
[im 128/352  lung]
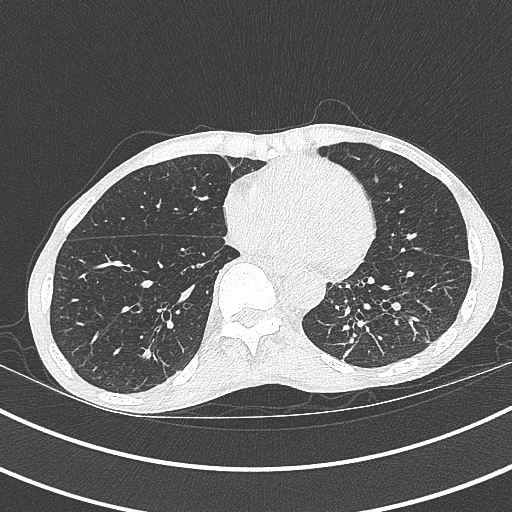
[im 160/352  lung]
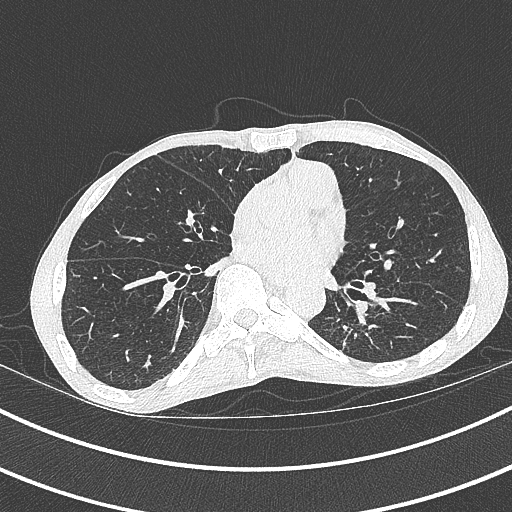
[im 192/352  lung]
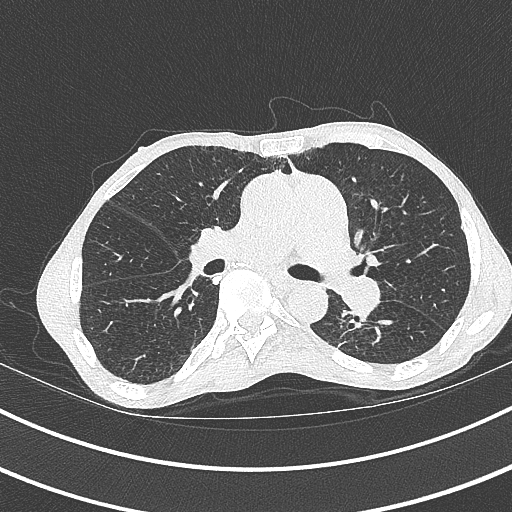
[im 224/352  lung]
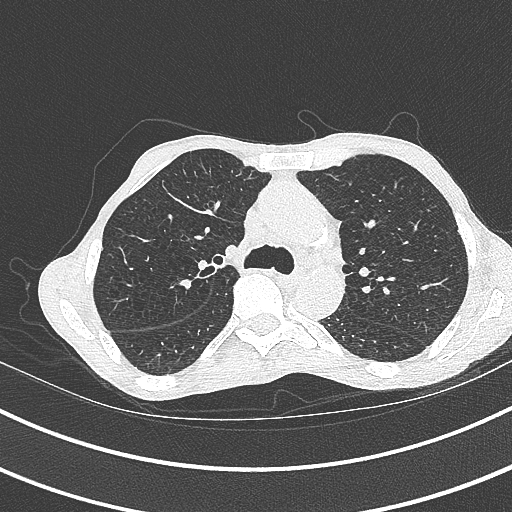
[im 240/352  mediastinal]
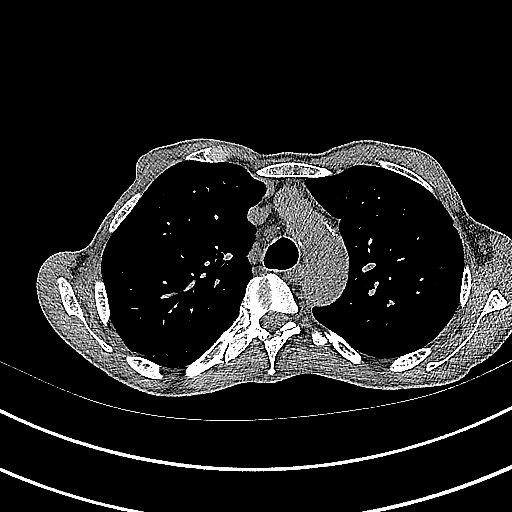
[im 240/352  lung]
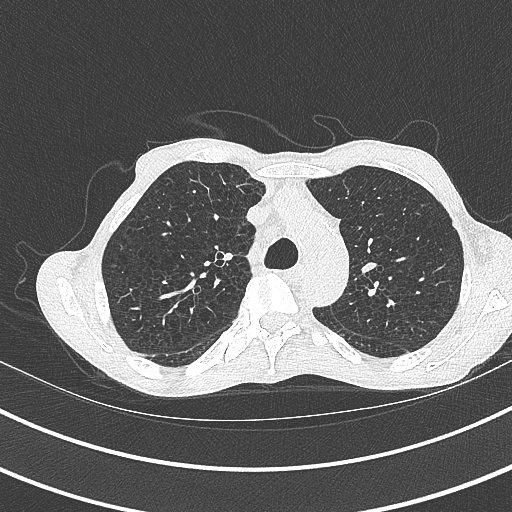
[im 272/352  lung]
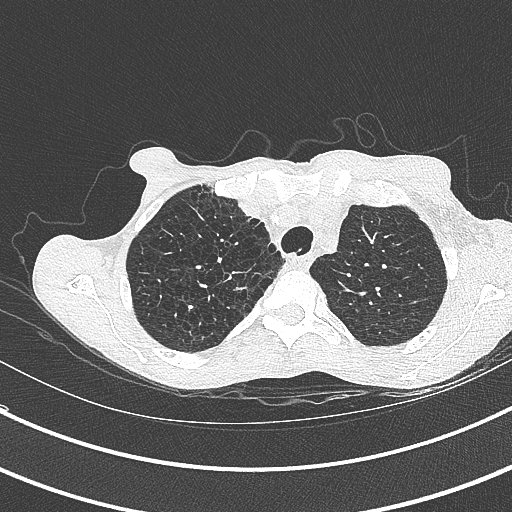
[im 304/352  lung]
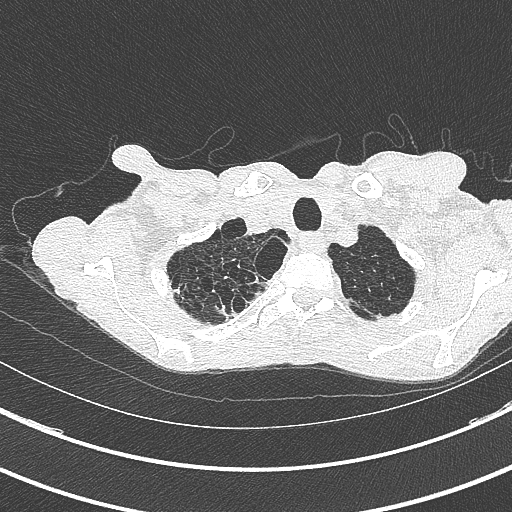
[im 336/352  lung]
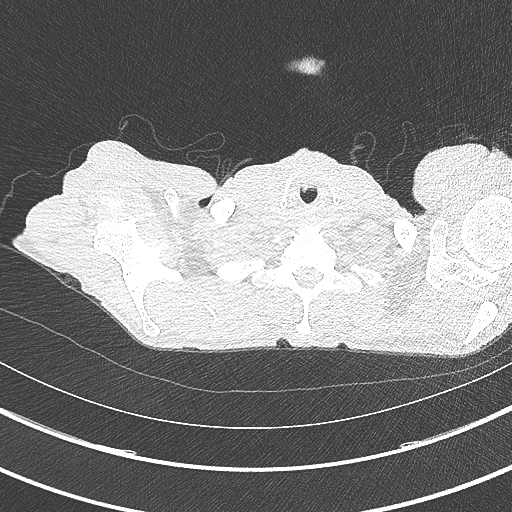

[Series 5: coronal · coronal · 0.59mm/px · 3 of 89 slices shown]
[im 18/89  lung]
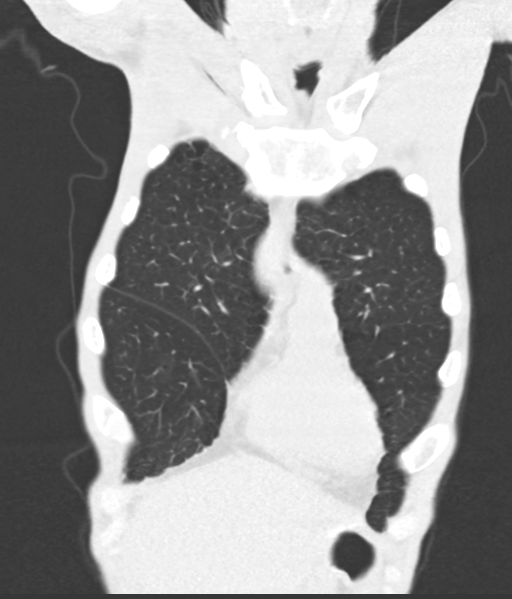
[im 36/89  lung]
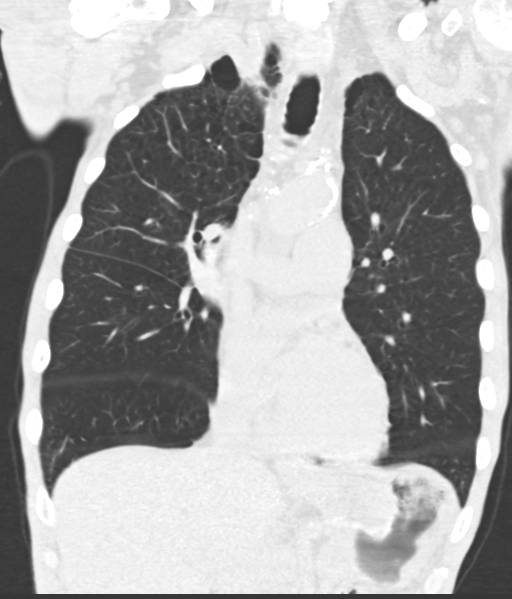
[im 53/89  lung]
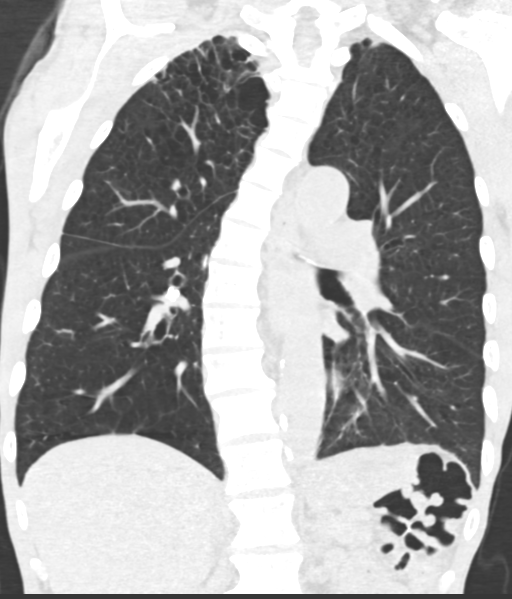

[15 of 36 positions shown; findings below may reference images not displayed]

FINDINGS: Cardiovascular: Atherosclerotic calcification of the aorta. Enlarged
pulmonic trunk. Heart size normal. No pericardial effusion.

Mediastinum/Nodes: Calcified mediastinal and hilar lymph nodes. No
pathologically enlarged mediastinal or axillary lymph nodes. Hilar
regions are otherwise difficult to definitively evaluate without IV
contrast. Esophagus is grossly unremarkable.

Lungs/Pleura: Centrilobular and paraseptal emphysema. Mild biapical
pleuroparenchymal scarring. Calcified granulomas. Bibasilar
scarring. Mild cylindrical bronchiectasis. 5.6 mm subpleural
posterior right upper lobe nodule, unchanged. No new or suspicious
pulmonary nodules. No pleural fluid. Adherent debris in the airway.

Upper Abdomen: Visualized portions of the liver, adrenal glands,
kidneys, spleen, pancreas, stomach and bowel are grossly
unremarkable.

Musculoskeletal: Dextroconvex scoliosis. No worrisome lytic or
sclerotic lesions.
IMPRESSION: 1. Lung-RADS 2, benign appearance or behavior. Continue annual
screening with low-dose chest CT without contrast in 12 months.
2.  Aortic atherosclerosis (GUGR8-UF8.8).
3. Enlarged pulmonic trunk, indicative of pulmonary arterial
hypertension.
4.  Emphysema ([id number]).

## 2024-04-16 ENCOUNTER — Encounter: Payer: Self-pay | Admitting: *Deleted

## 2024-11-12 ENCOUNTER — Telehealth: Payer: Self-pay | Admitting: *Deleted

## 2024-11-12 NOTE — Telephone Encounter (Signed)
 Copied from CRM [id number]. Topic: Appointments - Scheduling Inquiry for Clinic >> Nov 12, 2024 11:45 AM Thersia BROCKS wrote: Reason for CRM: Patient called in wanting to be schedule for annual medicare wellness, informed him that he hasn't been seen in 3 years which would make him a new patient and Dr.Parker isnt accepting new patient , wanted to know if Dr.Parker could take him back and reestablish care . Would like a callback if so

## 2024-11-12 NOTE — Telephone Encounter (Signed)
 Pt is asking if Matthew Coleman would take him back as a pt. He is no longer a patient at this point. Please advise

## 2024-11-13 NOTE — Telephone Encounter (Signed)
 That is ok to schedule.  Worth HERO. Kennyth, MD 11/13/2024 10:38 AM

## 2024-11-13 NOTE — Telephone Encounter (Signed)
 That is ok to schedule Per Dr Kennyth

## 2024-11-15 NOTE — Telephone Encounter (Signed)
 Pt has been scheduled for 12/11/24 @ 9 AM w/ Dr. Kennyth.

## 2024-11-15 NOTE — Telephone Encounter (Signed)
 Called pt and LVM informing of PCP approval to be taken back as new patient. Informed to ask for front office when calling back for scheduling.

## 2024-12-11 ENCOUNTER — Ambulatory Visit: Admitting: Family Medicine
